# Patient Record
Sex: Female | Born: 1970 | Race: White | Hispanic: No | Marital: Single
Health system: Southern US, Community
[De-identification: ages and names within clinical notes are randomized; demographics above are authoritative.]

---

## 1998-04-04 ENCOUNTER — Other Ambulatory Visit: Admission: RE | Admit: 1998-04-04 | Discharge: 1998-04-04 | Payer: Self-pay | Admitting: Obstetrics and Gynecology

## 1998-06-30 ENCOUNTER — Other Ambulatory Visit: Admission: RE | Admit: 1998-06-30 | Discharge: 1998-06-30 | Payer: Self-pay | Admitting: Obstetrics and Gynecology

## 1998-07-22 ENCOUNTER — Ambulatory Visit (HOSPITAL_COMMUNITY): Admission: RE | Admit: 1998-07-22 | Discharge: 1998-07-22 | Payer: Self-pay | Admitting: Obstetrics and Gynecology

## 1999-04-20 ENCOUNTER — Other Ambulatory Visit: Admission: RE | Admit: 1999-04-20 | Discharge: 1999-04-20 | Payer: Self-pay | Admitting: Internal Medicine

## 1999-04-20 ENCOUNTER — Encounter (INDEPENDENT_AMBULATORY_CARE_PROVIDER_SITE_OTHER): Payer: Self-pay | Admitting: Specialist

## 1999-05-24 ENCOUNTER — Other Ambulatory Visit: Admission: RE | Admit: 1999-05-24 | Discharge: 1999-05-24 | Payer: Self-pay | Admitting: Obstetrics and Gynecology

## 2000-09-06 ENCOUNTER — Encounter (INDEPENDENT_AMBULATORY_CARE_PROVIDER_SITE_OTHER): Payer: Self-pay

## 2000-09-06 ENCOUNTER — Inpatient Hospital Stay (HOSPITAL_COMMUNITY): Admission: RE | Admit: 2000-09-06 | Discharge: 2000-09-07 | Payer: Self-pay | Admitting: *Deleted

## 2001-01-30 ENCOUNTER — Encounter: Admission: RE | Admit: 2001-01-30 | Discharge: 2001-01-30 | Payer: Self-pay | Admitting: Specialist

## 2001-01-30 ENCOUNTER — Encounter: Payer: Self-pay | Admitting: Specialist

## 2006-07-17 ENCOUNTER — Ambulatory Visit (HOSPITAL_COMMUNITY): Admission: RE | Admit: 2006-07-17 | Discharge: 2006-07-18 | Payer: Self-pay | Admitting: Neurosurgery

## 2006-10-17 ENCOUNTER — Encounter: Admission: RE | Admit: 2006-10-17 | Discharge: 2006-10-17 | Payer: Self-pay | Admitting: Neurosurgery

## 2008-08-15 IMAGING — CR DG CERVICAL SPINE 1V
1 series · 1 of 1 positions shown · non-contrast
Comparison: none

CLINICAL DATA: Anterior fusion C5-6. 
 CERVICAL SPINE ONE VIEW:
 Comparison to 07/17/06.

[view not recorded]
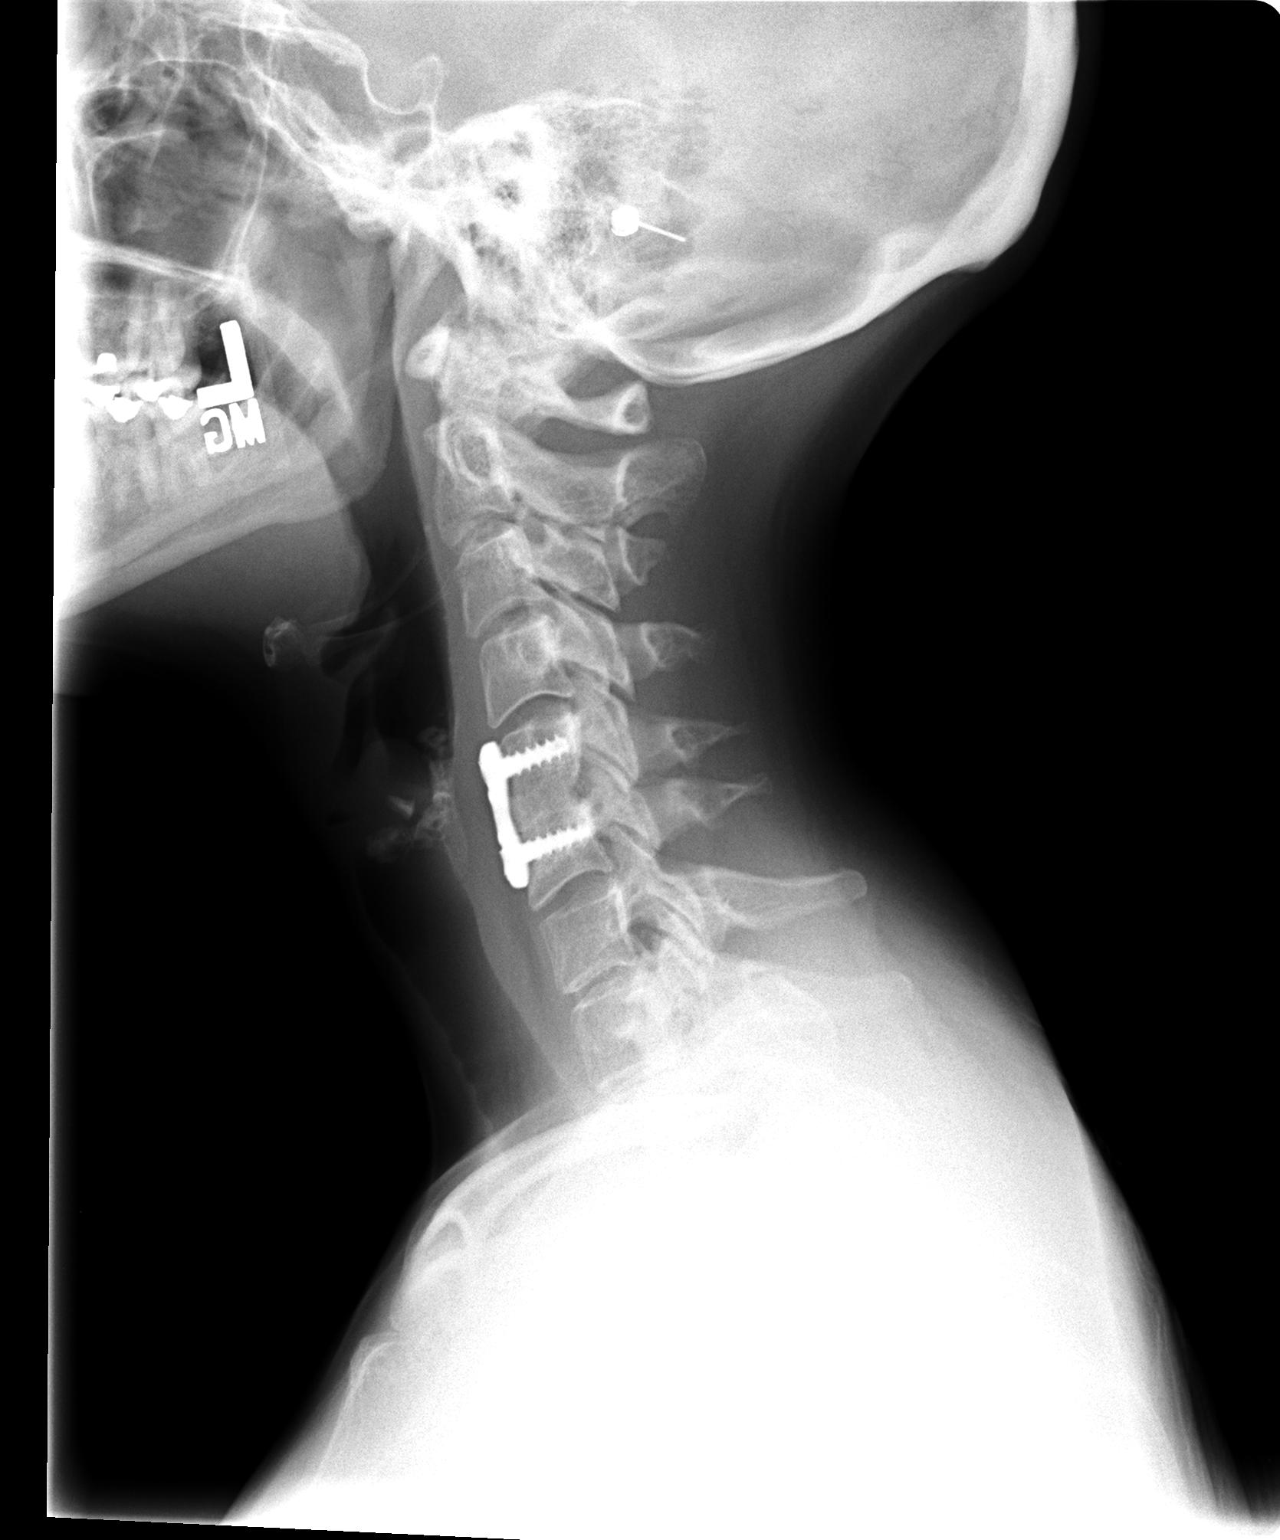

[1 of 1 positions shown; findings below may reference images not displayed]

FINDINGS: Anterior plate, screw fixation, and interbody fusion material C5-6 identified.  No evidence of acute abnormalities or complicating features. 
 Minimal degenerative disc disease C7-T1 and C3-4 noted.
IMPRESSION: Anterior fusion C5-6 without acute or complicating features.

## 2010-07-04 NOTE — Op Note (Signed)
Judith Gray, Judith Gray               ACCOUNT NO.:  [id number]   MEDICAL RECORD NO.:  [id number]          PATIENT TYPE:  AMB   LOCATION:  SDS                          FACILITY:  [facility]   PHYSICIAN:  Donalee Citrin, M.D.        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   PROCEDURE PERFORMED:  Anterior cervical diskectomy and fusion, C5-C6,  using a 7 mm allograft wedge and a 25 mm Venture plate with four 30 mm  screws.   SURGEON:  Donalee Citrin, M.D.   ASSISTANT SURGEON:  Tia Alert, MD.   ANESTHESIA:  General endotracheal.   HISTORY OF PRESENT ILLNESS:  The patient is a very pleasant 40 year old  female who sustained an accident resulting in severe neck trauma with  neck pain with radiation down her left arm into her thumb and  forefinger.  Work-up revealed an MRI scan which showed a ruptured disk  at C5-C6 causing cervical spinal stenosis with spinal cord compression  and left C6 foraminal stenosis.  The patient failed all forms of  conservative treatment and was recommended anterior cervical diskectomy  and fusion.  The patient also has some mild disk bulge at C3-C4 and C4-  C5 that were felt to be not causing significant spinal cord compression  and, on intraoperative cervical flexion and extension films, there was  no evidence of instability at C4-C5, so it was felt that this level  could be left alone.  The patient was preoperatively counseled on the  risks and benefits of single- versus two-level anterior cervical  diskectomy and fusion, and she understands and agreed to doing a one-  level anterior cervical diskectomy and fusion at C5-C6 with leaving  alone C4-C5 and subsequently C3-C4.  The risks and benefits of this were  explained to the patient.   DESCRIPTION OF PROCEDURE:  The patient was brought into the operating  room and was induced under general anesthesia.  Cervical flexion and  extension films were performed as indicated before, showing no evidence  of instability at C4-C5.  Then the patient's right side of the neck was  prepped and draped in the usual sterile fashion.  After her head was  placed in slight extension and 5 pounds halter traction of the right  side of the neck was prepped and draped in the sterile fashion.  After  intraoperative x-ray confirmed appropriate positioning at the  appropriate level, a curvilinear incision was made just off the midline  to the anterior portion of the __________ superficial layer of the  platysma dissected anterolateral and longitudinally.  The avascular  plane of the sternocleidomastoid and the strap muscles was divided down  to the prevertebral fascia.  The prevertebral fascia __________ .  Intraoperative x-ray confirmed our position to be appropriate at C4-C5.   Attention was taken one disk space below this  and the annulotomy was  extended with a #15 blade scalpel along its length, self-retaining  retractors were placed.  Then the anterior aspect of the C5 vertebral  body was bitten off with a 2 mm Kerrison and then 2 and 3 mm Kerrison  punch and then the disk space was cleaned out using a high speed drill.  Initially this was drilled down to the posterior annulus, the posterior  osteomeatal complexes.  At this point __________ under microscopic  examination using a 1 Kerrison.  The undersurface of the C5 and C6  vertebral bodies were removed as well as the posterior annulus.  The  posterior longitudinal ligament was then markedly disrupted with some  sub ligamentous disk herniation.  So using a 1 Kerrison __________ , the  posterior __________ ligament was planed between that and the dura was  developed.  Several large __________ of disk were  removed.  It migrated  sub ligamentous and when the ligament was removed the dura was  immediately visualized and then aggressive underbiting of both endplates  as well as both proximal neuroforamina was performed, decompressing both  C6 nerve roots flush with the pedicle.  The left C5-C6 nerve root was  skeletonized and noted to be widely decompressed as well as the right.  Again, the endplates were further underbitten to decompress the central  canal.  The endplates were scraped.  A size 7 mm graft was inserted 1 mm  deep to the intervertebral line and a 25 mm Venture plate was placed.  Four 32 mm screws were placed.  All screws had excellent purchase.  The  locking mechanism was engaged.  The wound was then copiously irrigated.  Meticulous hemostasis was maintained.  Then the wound was closed in  layers with interrupted Vicryl and a running 4-0 subcuticular in the  skin.  Benzoin and Steri-Strips were applied.  The patient was taken to  the recovery room in stable condition.  At the end of the case, needle,  instrument and sponge counts were correct.           ______________________________  Donalee Citrin, M.D.     GC/MEDQ  D:  07/17/2006  T:  07/17/2006  Job:  [id number]

## 2010-07-07 NOTE — Discharge Summary (Signed)
[facility]  Patient:    Judith Gray, Judith Gray                      MRN: [id number] Adm. Date:  [id number] Disc. Date: [id number] Attending:  Pleas Koch                           Discharge Summary  ADMISSION DIAGNOSES:          Dysmenorrhea, dyspareunia, chronic pelvic pain.  DISCHARGE DIAGNOSES:          Dysmenorrhea, dyspareunia, chronic pelvic pain.  HISTORY:                      The patient is a 40 year old gravida 3, para 2 with severe dysmenorrhea and dyspareunia unresponsive to medical management who was admitted and underwent a laparoscopically assisted vaginal hysterectomy on July 19.  Blood loss was 100 cc.  The patient did well postoperatively.  She had some nausea on the day of surgery but by the first day that had resolved.  She was advanced from liquids to solids after passing flatus.  She voided spontaneously with the catheter removed.  She was ambulating.  Her abdomen was soft and flat.  Laparoscopic ports were intact. Her postoperative hemoglobin was 10.2 from a preoperative of 12.6.  She was not tachycardic nor dizzy.  She was discharged in good condition.  She was taking Vicodin p.o. along with Motrin p.o.  She was sent home with prescriptions for Vicodin and Motrin.  Follow-up in the office in two weeks. Given a written discharge sheet.  She has instructions to call for fever, problems with bowel or bladder function, pain management issues, or heavy bleeding.DD:  09/07/00 TD:  09/09/00 Job: [id number]

## 2010-07-07 NOTE — H&P (Signed)
[facility]  Patient:    Judith Gray, Judith Gray                      MRN: [id number] Adm. Date:  [id number] Attending:  Pleas Koch                         History and Physical  ADMISSION DIAGNOSIS:          Dyspareunia, pelvic pain and dysmenorrhea.  HISTORY OF PRESENT ILLNESS:   This is a 40 year old, gravida 3, para 2, with a history of pelvic pain and two previous laparoscopies in 1998 with lysis of adhesions of the left tube and ovary and a right salpingo-oophorectomy in 2000.  She has had, over the last three months, worsening pain requiring her to use Vicodin.  Nonsteroidal antiinflammatory agents are not helping.  The patient has initial symptoms beginning approximately in 1990.  The patient presents for laparoscopic-assisted vaginal hysterectomy and evaluate the left adnexa.  PAST MEDICAL HISTORY:         Previous laparoscopies as above.  No history of heart murmur or kidney disease, diabetes or hypertension.  SOCIAL HISTORY:               She is a non smoker.  No drugs or alcohol.  ALLERGIES:                    She has no known drug allergies.  FAMILY HISTORY:               Significant for heart disease, diabetes, and cancer.  MEDICATIONS:                  She is taking Tylenol and Vicodin for medications.  REVIEW OF SYSTEMS:            Otherwise negative.  PHYSICAL EXAMINATION:  VITAL SIGNS:                  Temperature 97.2, pulse 62, respiratory rate 16, blood pressure 109/65.  GENERAL:                      A well-developed, well-nourished, white female, alert and oriented with normal mood and affect.  HEENT:                        Normal.  NECK:                         Normal.  Thyroid normal.  LUNGS:                        Clear to auscultation.  HEART:                        Regular rate and rhythm.  BREASTS:                      Normal.  ABDOMEN:                      Soft, nontender.  No masses or  organomegaly. Tender left lower quadrant.  No rebound.  No CVA tenderness.  No adenopathy.  PELVIC:  External genital is normal.  BUS normal. Vaginal normal.  Cervix normal.  There is tenderness over the uterus and with moving the cervix anterior to posterior.  Uterus is normal in size, shape and consistently anteriorly without fixation or nodularity in the uterosacral ligaments.  Tenderness in the left adnexa without enlargement is appreciated.  RECTAL:                       Deferred.  EXTREMITIES:                  Without cyanosis or edema.  SKIN:                         Warm and dry.  LABORATORY DATA:              The patients preoperative hemoglobin is 12.6, WBC 6.0.  DISPOSITION:                  The patient is to be admitted for evaluation of LAVH, evaluation of left ovary with conservation if possible.  She is aware or risks and complications of the procedure including bowel, bladder, or vascular injury, history of the aforementioned, persistence of recurrent pain.  She accepts these and is willing to proceed, will proceed with surgery at [facility] on September 06, 2000. DD:  09/06/00 TD:  09/06/00 Job: [id number]

## 2010-07-07 NOTE — Op Note (Signed)
[facility]  Patient:    Judith Gray, Judith Gray                      MRN: [id number] Proc. Date: 09/06/00 Adm. Date:  [id number] Attending:  Pleas Koch                           Operative Report  PREOPERATIVE DIAGNOSES:       Dysmenorrhea, dyspareunia, and chronic pelvic pain.  POSTOPERATIVE DIAGNOSIS:      Dysmenorrhea, dyspareunia, and chronic pelvic pain.  PROCEDURE PERFORMED:          Laparoscopically-assisted vaginal hysterectomy.  SURGEON:                      Georgina Peer, M.D.  ASSISTANT:                    Raynald Kemp, M.D.  ANESTHESIA:                   General.  ESTIMATED BLOOD LOSS:         100 cc.  COMPLICATIONS:                None.  FINDINGS:                     Normal left ovary and tube with no adhesions, slightly mottled uterus, absent right ovary.  INDICATIONS:                  A 40 year old gravida 2, para 2 with a history of recurrent dyspareunia and dysmenorrhea after two prior laparoscopies within the last five years.  Narcotics were required for pain management.  She is admitted for LAVH.  For complete details, see History.  DESCRIPTION OF PROCEDURE:     The patient was taken to the operating room and given a general anesthetic, placed in the dorsal lithotomy position.  Abdomen, vagina, and urethral area were sterilely cleansed.  The bladder was emptied with a catheter.  A Hulka uterine manipulator was placed in through the cervix.  A vertical subumbilical incision was made and a Verres needle was placed, and 2.5 liters of carbon dioxide gas was insufflated under low pressure.  Laparoscopic trocar and sleeve were placed and then under direct vision, a 5-mm suprapubic port and a 10-mm left mid quadrant port were placed. There appeared to be no injury from laparoscopic trocar placement.  The surface of the bowel and appendix looked normal.  The uterus was slightly mottled but otherwise was normal and mobile.   The left ovary and left tube: There was no evidence of adhesions or endometriosis.  There were no peritoneal defects.  Proceeding with the LAVH, a LigaSure laparascopic instrument was used to ligate and divide the utero-ovarian ligaments and the broad ligament down to the uterine vessels.  The LigaSure device was used to divide the round ligament and the broad ligament on the right side with the absence of the right tube and ovary being previously noted.  The uterus was then identified and found to be clear of the dissection plane.  An anterior bladder flap was created.  Any bleeders were cauterized and blood was irrigated away.  The vaginal part of the procedure then commenced by placing a weighted speculum in the vagina.  The cervix was grasped with a tenaculum and injected with a total  of 20 cc of a dilute Neo-Synephrine solution.  The cervix was then incised. Because it dissected off the submucosa posteriorly, the cul-de-sac was entered.  The uterosacral ligaments bilaterally were clamped, cut, and suture-ligated.  Anteriorly, the peritoneal cavity was entered and the bladder retracted cephalad.  The vaginal LigaSure device was then used to ligate and divide the uterine vessels and the remainder of the broad ligament connections until the uterus was completely separated and was removed.  Some bleeding around the pedicles was controlled with fine sutures.  The posterior vaginal cuff in between the uterosacral ligaments was whipstitched with a running locked suture of Vicryl.  There was excellent hemostasis.  The vaginal cuff was then closed anteriorly to posteriorly with absorbable suture. Visualizing laparoscopically, there was a small bleeder at the right pelvic sidewall which was controlled with bipolar cautery.  The pelvis was irrigated. Photodocumentation of all findings was made with still photographs.  Sponge, needle, and instrument counts were correct.  The patient received Ancef  1 g preoperatively.  A Foley catheter was placed.  No vaginal packing was needed. Urine was clear and yellow at the end of the case and there appeared to be no obstruction to the ureters.  At this point, the laparoscopic ports were removed.  The gas was allowed to escape.  The fascia was closed with a Vicryl suture in the 10-mm ports and the skin ports were injected with a total of 10 cc of 0.25% Marcaine and then closed with subcuticular Dexon.  Sponge, needle, and instrument counts were again correct.  The patient was returned to the recovery area in good condition. DD:  09/06/00 TD:  09/08/00 Job: [id number]

## 2012-02-11 ENCOUNTER — Other Ambulatory Visit (HOSPITAL_COMMUNITY): Payer: Self-pay | Admitting: Oral Surgery

## 2012-03-03 ENCOUNTER — Ambulatory Visit: Admit: 2012-03-03 | Payer: Self-pay | Admitting: Oral Surgery

## 2012-03-03 SURGERY — ARTHROTOMY
Anesthesia: General | Site: Mouth | Laterality: Left

## 2020-04-21 ENCOUNTER — Telehealth: Payer: Self-pay | Admitting: *Deleted

## 2020-04-21 NOTE — Telephone Encounter (Signed)
Patient did not call us back to reschedule the PV. No show letter mailed to the patient. Colonoscopy and PV cancelled.

## 2020-04-21 NOTE — Telephone Encounter (Signed)
Called patient, still no answer, left a message.

## 2020-04-21 NOTE — Telephone Encounter (Signed)
Patient no show PV appointment for today. I called patient, no answer, left a message for the patient to call us back today before 5 pm or the PV and procedure will be cancelled  

## 2020-05-06 ENCOUNTER — Encounter: Payer: Self-pay | Admitting: Gastroenterology

## 2021-03-06 DIAGNOSIS — R0789 Other chest pain: Secondary | ICD-10-CM | POA: Diagnosis not present
